# Patient Record
Sex: Female | Born: 2000 | Race: Black or African American | Hispanic: No | Smoking: Never smoker
Health system: Southern US, Community
[De-identification: ages and names within clinical notes are randomized; demographics above are authoritative.]

---

## 2018-11-05 ENCOUNTER — Emergency Department (HOSPITAL_COMMUNITY)
Admission: EM | Admit: 2018-11-05 | Discharge: 2018-11-05 | Disposition: A | Discharge status: Home / Self Care | Payer: Medicaid - Out of State | Attending: Emergency Medicine | Admitting: Emergency Medicine

## 2018-11-05 ENCOUNTER — Other Ambulatory Visit: Payer: Self-pay

## 2018-11-05 ENCOUNTER — Encounter (HOSPITAL_COMMUNITY): Payer: Self-pay | Admitting: Emergency Medicine

## 2018-11-05 ENCOUNTER — Emergency Department (HOSPITAL_COMMUNITY): Payer: Medicaid - Out of State

## 2018-11-05 DIAGNOSIS — R569 Unspecified convulsions: Secondary | ICD-10-CM

## 2018-11-05 LAB — CBG MONITORING, ED: Glucose-Capillary: 117 mg/dL — ABNORMAL HIGH (ref 70–99)

## 2018-11-05 LAB — CBC
HCT: 39.8 % (ref 36.0–46.0)
Hemoglobin: 13.4 g/dL (ref 12.0–15.0)
MCH: 30.7 pg (ref 26.0–34.0)
MCHC: 33.7 g/dL (ref 30.0–36.0)
MCV: 91.1 fL (ref 80.0–100.0)
Platelets: 260 10*3/uL (ref 150–400)
RBC: 4.37 MIL/uL (ref 3.87–5.11)
RDW: 11.9 % (ref 11.5–15.5)
WBC: 14.1 10*3/uL — ABNORMAL HIGH (ref 4.0–10.5)
nRBC: 0 % (ref 0.0–0.2)

## 2018-11-05 LAB — I-STAT BETA HCG BLOOD, ED (MC, WL, AP ONLY): I-stat hCG, quantitative: <5 m[IU]/mL (ref <5)

## 2018-11-05 LAB — BASIC METABOLIC PANEL
Anion gap: 8 (ref 5–15)
BUN: 14 mg/dL (ref 6–20)
CO2: 22 mmol/L (ref 22–32)
Calcium: 9.3 mg/dL (ref 8.9–10.3)
Chloride: 107 mmol/L (ref 98–111)
Creatinine, Ser: 1.08 mg/dL — ABNORMAL HIGH (ref 0.44–1.00)
GFR calc Af Amer: >60 mL/min (ref >60)
GFR calc non Af Amer: >60 mL/min (ref >60)
Glucose, Bld: 123 mg/dL — ABNORMAL HIGH (ref 70–99)
Potassium: 3.6 mmol/L (ref 3.5–5.1)
Sodium: 137 mmol/L (ref 135–145)

## 2018-11-05 MED ORDER — LACTATED RINGERS IV BOLUS
1000.0000 mL | Freq: Once | INTRAVENOUS | Status: AC
  Administered 2018-11-05: 04:00:00 1000 mL via INTRAVENOUS

## 2018-11-05 NOTE — ED Provider Notes (Signed)
[facility] EMERGENCY DEPARTMENT Provider Note   CSN: [id number] Arrival date & time: 11/05/18  0220    History   Chief Complaint Chief Complaint  Patient presents with  . Seizures    HPI Kristy Banks is a 18 y.o. female.      Seizures  Seizure activity on arrival: no   Seizure type:  Grand mal Preceding symptoms: no sensation of an aura present   Initial focality:  None Episode characteristics: abnormal movements, confusion and generalized shaking   Postictal symptoms: confusion   Return to baseline: yes   Severity:  Moderate Timing:  Once Progression:  Resolved Context: not alcohol withdrawal, not change in medication, not drug use, not emotional upset, not family hx of seizures and not fever     History reviewed. No pertinent past medical history.  There are no active problems to display for this patient.   History reviewed. No pertinent surgical history.   OB History   No obstetric history on file.      Home Medications    Prior to Admission medications   Not on File    Family History History reviewed. No pertinent family history.  Social History Social History   Tobacco Use  . Smoking status: Never Smoker  . Smokeless tobacco: Never Used  Substance Use Topics  . Alcohol use: Not on file  . Drug use: Not on file     Allergies   Patient has no known allergies.   Review of Systems Review of Systems  Neurological: Positive for seizures.  All other systems reviewed and are negative.    Physical Exam Updated Vital Signs BP 123/86   Pulse 99   Temp 98.5 F (36.9 C) (Oral)   Resp 16   Ht 5\' 1"  (1.549 m)   Wt 65.8 kg   SpO2 100%   BMI 27.40 kg/m   Physical Exam Vitals signs and nursing note reviewed.  Constitutional:      Appearance: She is well-developed.  HENT:     Head: Normocephalic and atraumatic.     Mouth/Throat:     Mouth: Mucous membranes are moist.  Eyes:     Extraocular Movements: Extraocular  movements intact.     Conjunctiva/sclera: Conjunctivae normal.  Neck:     Musculoskeletal: Normal range of motion.  Cardiovascular:     Rate and Rhythm: Normal rate and regular rhythm.  Pulmonary:     Effort: No respiratory distress.     Breath sounds: No stridor.  Abdominal:     General: Abdomen is flat. There is no distension.  Musculoskeletal: Normal range of motion.        General: No swelling or tenderness.  Skin:    General: Skin is warm and dry.  Neurological:     General: No focal deficit present.     Mental Status: She is alert and oriented to person, place, and time.     Cranial Nerves: No cranial nerve deficit.     Sensory: No sensory deficit.     Motor: No weakness.     Coordination: Coordination normal.     Gait: Gait normal.     Deep Tendon Reflexes: Reflexes normal.      ED Treatments / Results  Labs (all labs ordered are listed, but only abnormal results are displayed) Labs Reviewed  BASIC METABOLIC PANEL - Abnormal; Notable for the following components:      Result Value   Glucose, Bld 123 (*)    Creatinine, Ser  1.08 (*)    All other components within normal limits  CBC - Abnormal; Notable for the following components:   WBC 14.1 (*)    All other components within normal limits  CBG MONITORING, ED - Abnormal; Notable for the following components:   Glucose-Capillary 117 (*)    All other components within normal limits  I-STAT BETA HCG BLOOD, ED ([facility], [facility], [facility] ONLY)    EKG None  Radiology Ct Head Wo Contrast  Result Date: 11/05/2018 CLINICAL DATA:  18 y/o  F; seizure activity. EXAM: CT HEAD WITHOUT CONTRAST TECHNIQUE: Contiguous axial images were obtained from the base of the skull through the vertex without intravenous contrast. COMPARISON:  None. FINDINGS: Brain: No evidence of acute infarction, hemorrhage, hydrocephalus, extra-axial collection or mass lesion/mass effect. Vascular: No hyperdense vessel or unexpected calcification. Skull: Normal.  Negative for fracture or focal lesion. Sinuses/Orbits: Moderate paranasal sinus mucosal thickening, large mucous retention cyst in the left maxillary sinus, sphenoid sinus aerosolized secretions. Normal aeration of the mastoid air cells. Orbits are unremarkable. Other: None. IMPRESSION: 1. No acute intracranial abnormality identified. Unremarkable CT of the brain. 2. Moderate paranasal sinus disease with aerosolized secretions which may represent acute sinusitis. Electronically Signed   By: Mitzi HansenLance  Furusawa-Stratton M.D.   On: 11/05/2018 03:19    Procedures Procedures (including critical care time)  Medications Ordered in ED Medications  lactated ringers bolus 1,000 mL (0 mLs Intravenous Stopped 11/05/18 0436)     Initial Impression / Assessment and Plan / ED Course  I have reviewed the triage vital signs and the nursing notes.  Pertinent labs & imaging results that were available during my care of the patient were reviewed by me and considered in my medical decision making (see chart for details).  Patient was sleeping with her grandmother grandmother woke up the patient shaking all extremities that actually were consistent with what it sounds are a seizure.  She was confused afterwards when EMS arrived she continued to be slightly confused but improved prior to arrival here.  Patient without any other associated symptoms.  Her exam here is normal to clear normal neurologic exam.  Work-up done and she was slightly tachycardic to ensure that her vital signs were fine and this was all normal.  Patient persistently normal neurologic exam.  Will be discharged to follow-up with neurology.  No antiepileptics at this time.  Final Clinical Impressions(s) / ED Diagnoses   Final diagnoses:  Seizure-like activity [facility](HCC)    ED Discharge Orders         Ordered    Ambulatory referral to Neurology    Comments:  An appointment is requested in approximately: 2 weeks   11/05/18 0358           Takera Rayl,  Barbara CowerJason, MD 11/05/18 0600

## 2018-11-05 NOTE — ED Triage Notes (Signed)
BIB EMS from home with reported new onset seizure. Family told EMS pt was sleeping when she began convulsing for a "couple minutes". No hx, meds or allergies. Per EMS, pt seemed to have absent seizure in their presence, lasting ~85min. Family reporting mild confusion afterward.

## 2018-11-05 NOTE — ED Notes (Addendum)
Reviewed d/c instructions with pt, who verbalized understanding and had no outstanding questions. Offered pt paper scrubs as she was in only thin pajamas, but pt refused. Pt departed in NAD, refused use of wheelchair.

## 2018-11-05 NOTE — ED Notes (Signed)
Patient transported to CT 

## 2018-11-13 ENCOUNTER — Telehealth: Payer: Self-pay | Admitting: Neurology

## 2020-08-01 IMAGING — CT CT HEAD WITHOUT CONTRAST
4 series · 16 of 47 positions shown, 18 images · non-contrast
Comparison: None.

CLINICAL DATA: 18 y/o  F; seizure activity.

EXAM:
CT HEAD WITHOUT CONTRAST
TECHNIQUE: Contiguous axial images were obtained from the base of the skull
through the vertex without intravenous contrast.

[Series 3: head wo · axial · 0.42mm/px · z∈[-172,-52]mm · 7 of 32 slices shown, 9 images]
[im 4/32  brain]
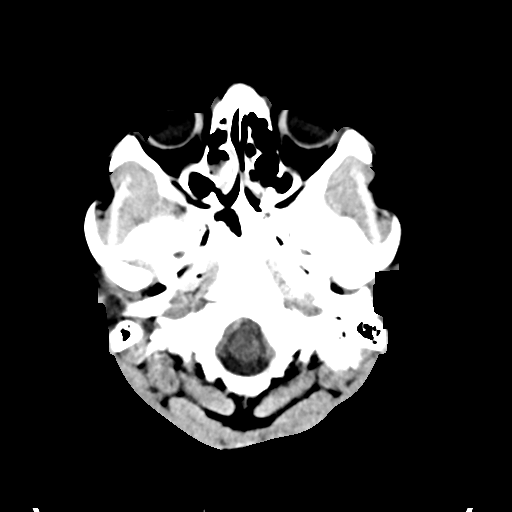
[im 4/32  bone]
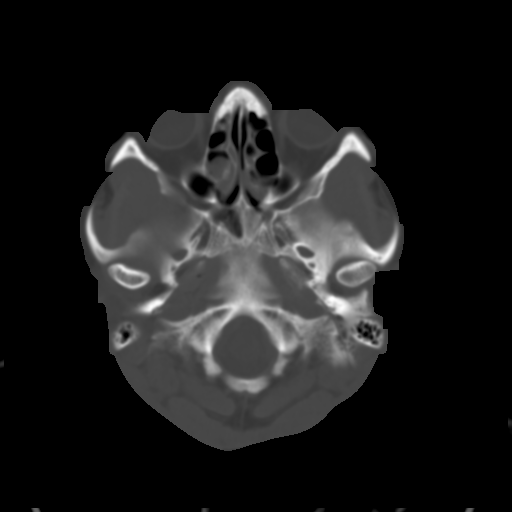
[im 8/32  brain]
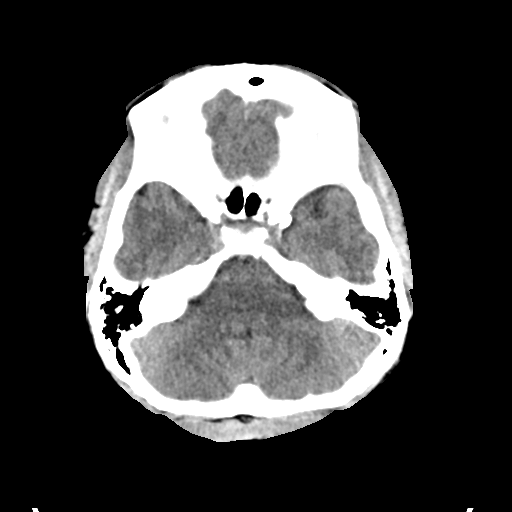
[im 12/32  brain]
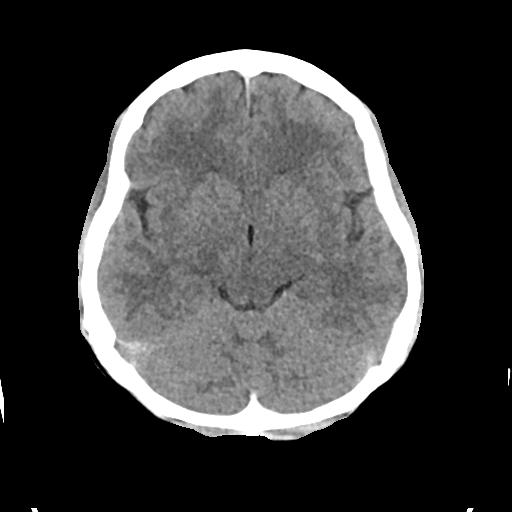
[im 16/32  brain]
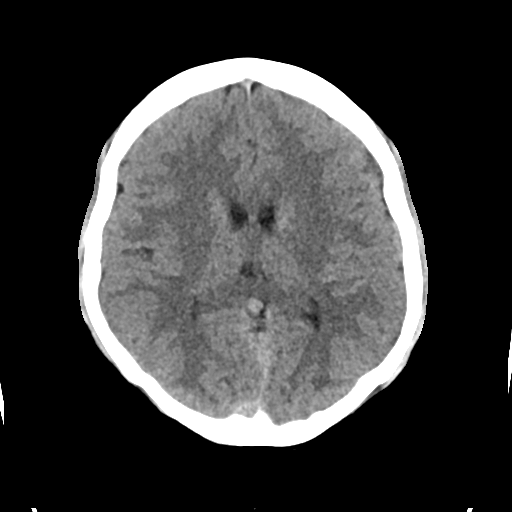
[im 20/32  brain]
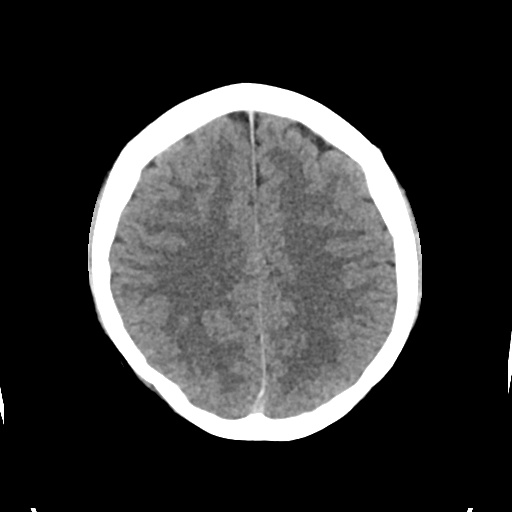
[im 20/32  bone]
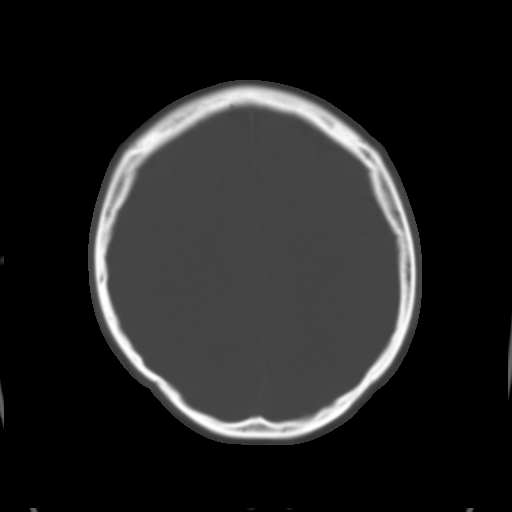
[im 24/32  brain]
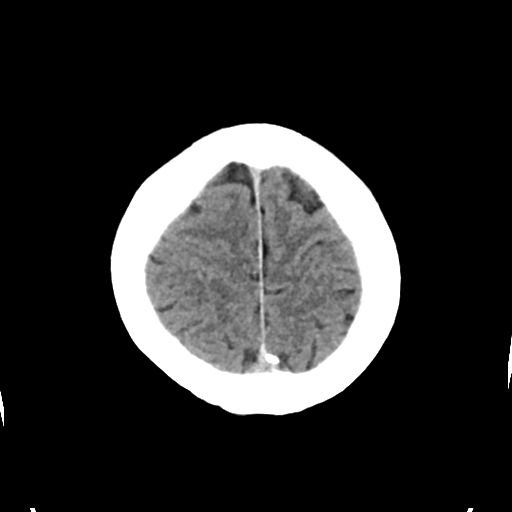
[im 28/32  brain]
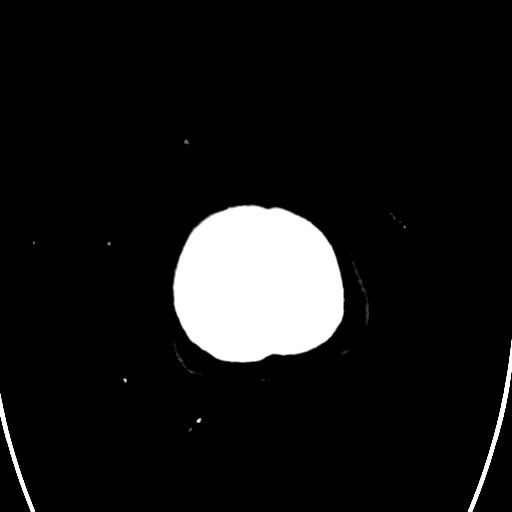

[Series 4: head bone · axial · 0.42mm/px · z∈[-173,-141]mm · 3 of 79 slices shown]
[im 8/79  bone]
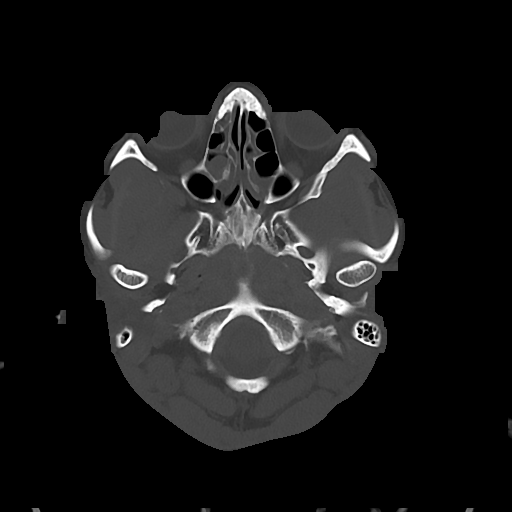
[im 16/79  bone]
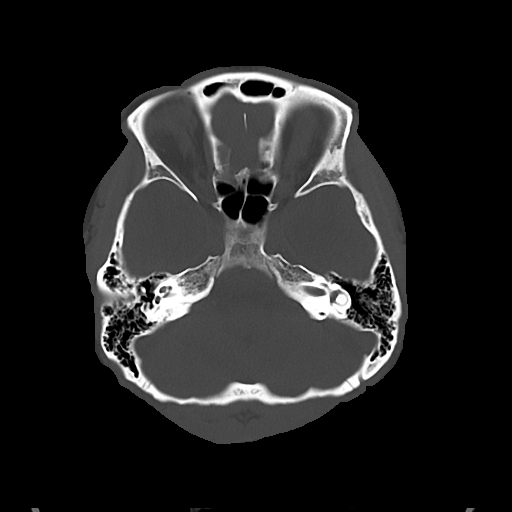
[im 24/79  bone]
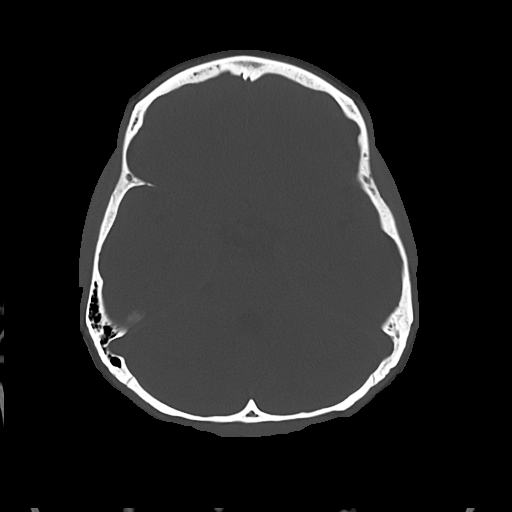

[Series 5: cor soft · coronal · 0.30mm/px · 3 of 66 slices shown]
[im 22/66  brain]
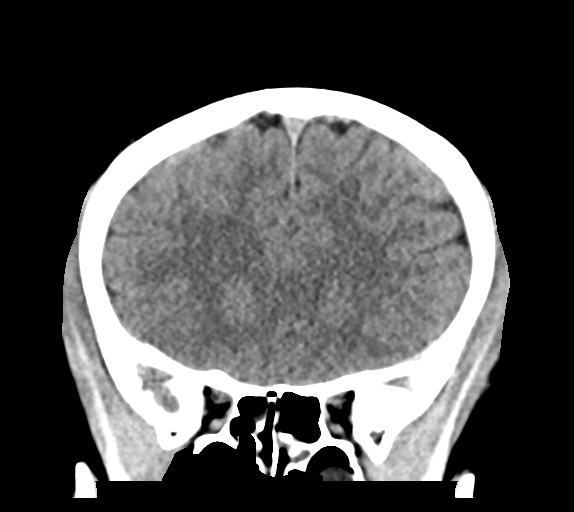
[im 29/66  brain]
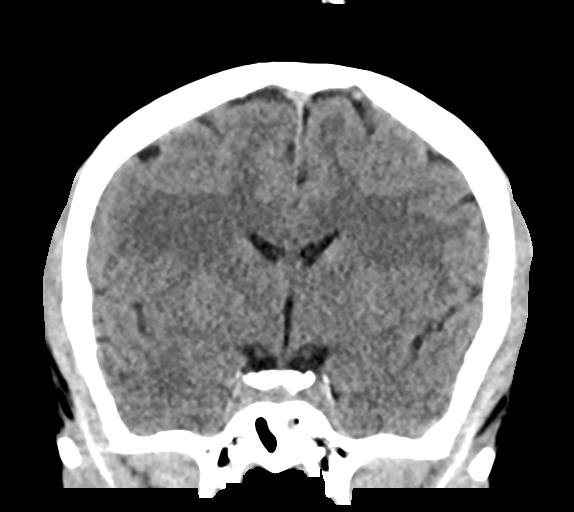
[im 37/66  brain]
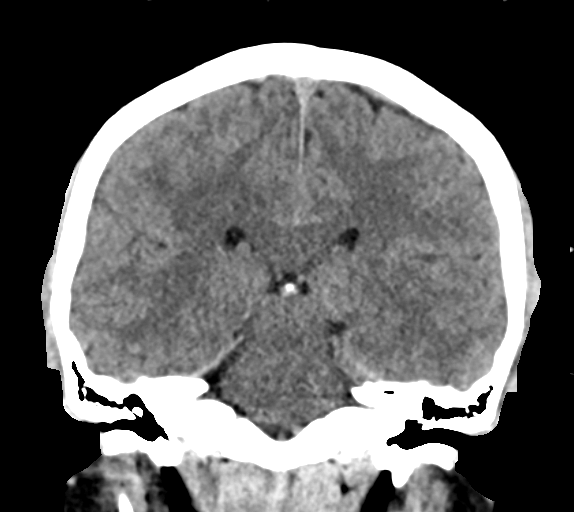

[Series 6: sag soft · sagittal · 0.30mm/px · 3 of 61 slices shown]
[im 21/61  brain]
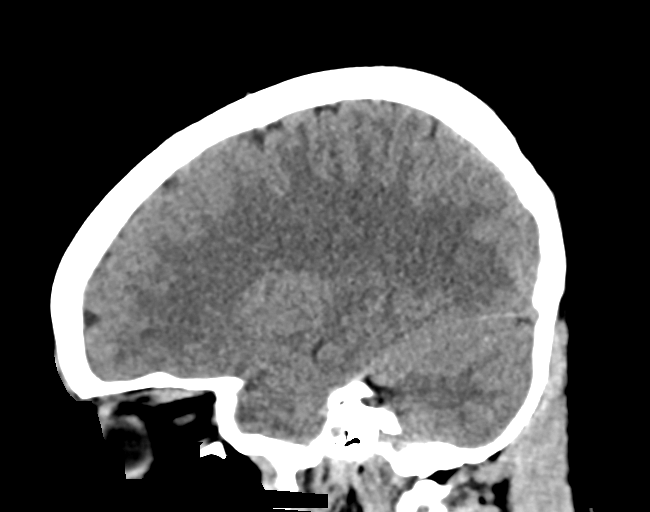
[im 31/61  brain]
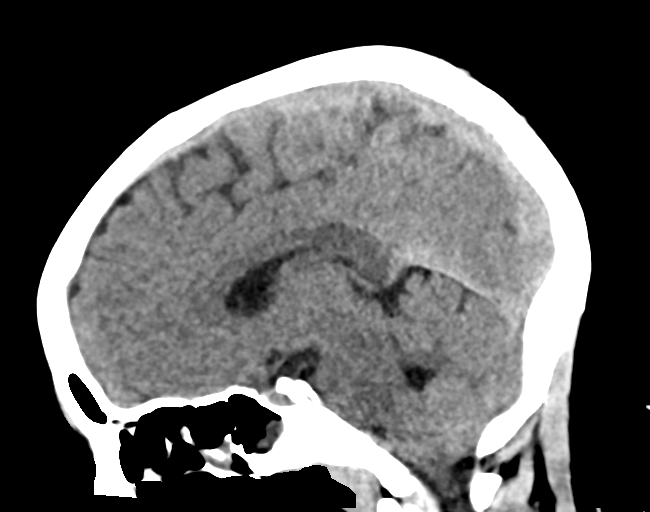
[im 41/61  brain]
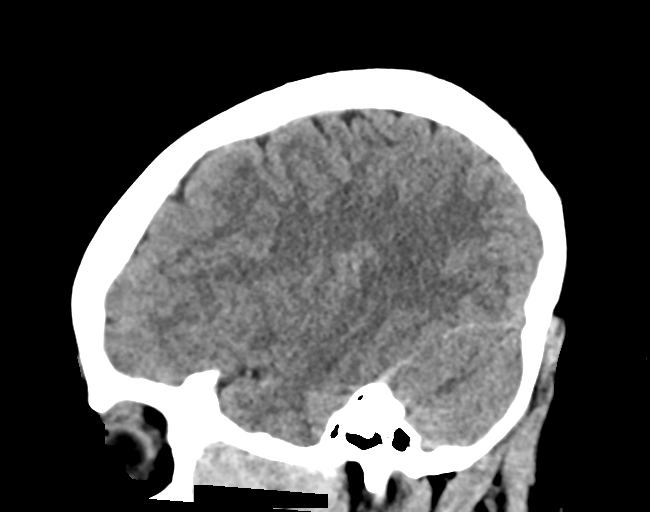

[16 of 47 positions shown; findings below may reference images not displayed]

FINDINGS: Brain: No evidence of acute infarction, hemorrhage, hydrocephalus,
extra-axial collection or mass lesion/mass effect.

Vascular: No hyperdense vessel or unexpected calcification.

Skull: Normal. Negative for fracture or focal lesion.

Sinuses/Orbits: Moderate paranasal sinus mucosal thickening, large
mucous retention cyst in the left maxillary sinus, sphenoid sinus
aerosolized secretions. Normal aeration of the mastoid air cells.
Orbits are unremarkable.

Other: None.
IMPRESSION: 1. No acute intracranial abnormality identified. Unremarkable CT of
the brain.
2. Moderate paranasal sinus disease with aerosolized secretions
which may represent acute sinusitis.
